# Patient Record
Sex: Female | Born: 1975
Health system: Southern US, Community
[De-identification: ages and names within clinical notes are randomized; demographics above are authoritative.]

## PROBLEM LIST (undated history)

## (undated) DIAGNOSIS — D499 Neoplasm of unspecified behavior of unspecified site: Secondary | ICD-10-CM

## (undated) HISTORY — PX: ABDOMINAL HYSTERECTOMY: SHX81

## (undated) HISTORY — PX: CHOLECYSTECTOMY: SHX55

---

## 1997-11-20 ENCOUNTER — Inpatient Hospital Stay (HOSPITAL_COMMUNITY): Admission: AD | Admit: 1997-11-20 | Discharge: 1997-11-20 | Payer: Self-pay | Admitting: Obstetrics and Gynecology

## 1997-12-27 ENCOUNTER — Inpatient Hospital Stay (HOSPITAL_COMMUNITY): Admission: AD | Admit: 1997-12-27 | Discharge: 1997-12-27 | Payer: Self-pay | Admitting: Obstetrics and Gynecology

## 1998-01-13 ENCOUNTER — Inpatient Hospital Stay (HOSPITAL_COMMUNITY): Admission: AD | Admit: 1998-01-13 | Discharge: 1998-01-13 | Payer: Self-pay | Admitting: *Deleted

## 1998-03-10 ENCOUNTER — Inpatient Hospital Stay (HOSPITAL_COMMUNITY): Admission: AD | Admit: 1998-03-10 | Discharge: 1998-03-10 | Payer: Self-pay | Admitting: Obstetrics and Gynecology

## 1998-03-14 ENCOUNTER — Inpatient Hospital Stay (HOSPITAL_COMMUNITY): Admission: AD | Admit: 1998-03-14 | Discharge: 1998-03-14 | Payer: Self-pay | Admitting: *Deleted

## 1998-03-17 ENCOUNTER — Inpatient Hospital Stay (HOSPITAL_COMMUNITY): Admission: AD | Admit: 1998-03-17 | Discharge: 1998-03-19 | Payer: Self-pay | Admitting: *Deleted

## 1998-03-22 ENCOUNTER — Inpatient Hospital Stay (HOSPITAL_COMMUNITY): Admission: AD | Admit: 1998-03-22 | Discharge: 1998-03-22 | Payer: Self-pay | Admitting: Obstetrics and Gynecology

## 1998-04-18 ENCOUNTER — Other Ambulatory Visit: Admission: RE | Admit: 1998-04-18 | Discharge: 1998-04-18 | Payer: Self-pay | Admitting: *Deleted

## 1999-05-07 ENCOUNTER — Other Ambulatory Visit: Admission: RE | Admit: 1999-05-07 | Discharge: 1999-05-07 | Payer: Self-pay | Admitting: Obstetrics and Gynecology

## 2001-03-17 ENCOUNTER — Other Ambulatory Visit: Admission: RE | Admit: 2001-03-17 | Discharge: 2001-03-17 | Payer: Self-pay | Admitting: Obstetrics and Gynecology

## 2001-06-08 ENCOUNTER — Emergency Department (HOSPITAL_COMMUNITY): Admission: EM | Admit: 2001-06-08 | Discharge: 2001-06-08 | Payer: Self-pay | Admitting: Emergency Medicine

## 2001-10-12 ENCOUNTER — Encounter: Payer: Self-pay | Admitting: Obstetrics and Gynecology

## 2001-10-12 ENCOUNTER — Ambulatory Visit (HOSPITAL_COMMUNITY): Admission: RE | Admit: 2001-10-12 | Discharge: 2001-10-12 | Payer: Self-pay | Admitting: Obstetrics and Gynecology

## 2001-12-01 ENCOUNTER — Encounter: Payer: Self-pay | Admitting: Obstetrics and Gynecology

## 2001-12-01 ENCOUNTER — Ambulatory Visit (HOSPITAL_COMMUNITY): Admission: RE | Admit: 2001-12-01 | Discharge: 2001-12-01 | Payer: Self-pay | Admitting: Obstetrics and Gynecology

## 2001-12-11 ENCOUNTER — Ambulatory Visit (HOSPITAL_COMMUNITY): Admission: RE | Admit: 2001-12-11 | Discharge: 2001-12-11 | Payer: Self-pay

## 2001-12-11 ENCOUNTER — Encounter (INDEPENDENT_AMBULATORY_CARE_PROVIDER_SITE_OTHER): Payer: Self-pay

## 2001-12-11 ENCOUNTER — Encounter (INDEPENDENT_AMBULATORY_CARE_PROVIDER_SITE_OTHER): Payer: Self-pay | Admitting: *Deleted

## 2002-02-09 ENCOUNTER — Observation Stay (HOSPITAL_COMMUNITY): Admission: RE | Admit: 2002-02-09 | Discharge: 2002-02-10 | Payer: Self-pay | Admitting: Obstetrics and Gynecology

## 2002-02-09 ENCOUNTER — Encounter (INDEPENDENT_AMBULATORY_CARE_PROVIDER_SITE_OTHER): Payer: Self-pay

## 2002-04-19 ENCOUNTER — Other Ambulatory Visit: Admission: RE | Admit: 2002-04-19 | Discharge: 2002-04-19 | Payer: Self-pay | Admitting: Obstetrics and Gynecology

## 2002-08-25 ENCOUNTER — Emergency Department (HOSPITAL_COMMUNITY): Admission: EM | Admit: 2002-08-25 | Discharge: 2002-08-26 | Payer: Self-pay | Admitting: Emergency Medicine

## 2003-10-24 ENCOUNTER — Inpatient Hospital Stay (HOSPITAL_COMMUNITY): Admission: EM | Admit: 2003-10-24 | Discharge: 2003-10-27 | Payer: Self-pay | Admitting: Psychiatry

## 2004-03-17 ENCOUNTER — Emergency Department (HOSPITAL_COMMUNITY): Admission: EM | Admit: 2004-03-17 | Discharge: 2004-03-17 | Payer: Self-pay | Admitting: Emergency Medicine

## 2004-07-15 ENCOUNTER — Emergency Department (HOSPITAL_COMMUNITY): Admission: EM | Admit: 2004-07-15 | Discharge: 2004-07-15 | Payer: Self-pay | Admitting: Emergency Medicine

## 2004-07-17 ENCOUNTER — Inpatient Hospital Stay (HOSPITAL_COMMUNITY): Admission: EM | Admit: 2004-07-17 | Discharge: 2004-07-18 | Payer: Self-pay | Admitting: Emergency Medicine

## 2008-04-23 ENCOUNTER — Emergency Department (HOSPITAL_COMMUNITY): Admission: EM | Admit: 2008-04-23 | Discharge: 2008-04-23 | Payer: Self-pay | Admitting: Emergency Medicine

## 2008-08-28 ENCOUNTER — Emergency Department (HOSPITAL_COMMUNITY): Admission: EM | Admit: 2008-08-28 | Discharge: 2008-08-28 | Payer: Self-pay | Admitting: Emergency Medicine

## 2009-01-10 ENCOUNTER — Emergency Department (HOSPITAL_COMMUNITY): Admission: EM | Admit: 2009-01-10 | Discharge: 2009-01-10 | Payer: Self-pay | Admitting: Emergency Medicine

## 2009-01-10 ENCOUNTER — Encounter (INDEPENDENT_AMBULATORY_CARE_PROVIDER_SITE_OTHER): Payer: Self-pay | Admitting: Emergency Medicine

## 2009-01-10 ENCOUNTER — Ambulatory Visit: Payer: Self-pay | Admitting: Vascular Surgery

## 2009-02-10 ENCOUNTER — Emergency Department (HOSPITAL_COMMUNITY): Admission: EM | Admit: 2009-02-10 | Discharge: 2009-02-10 | Payer: Self-pay | Admitting: Emergency Medicine

## 2009-06-02 ENCOUNTER — Emergency Department (HOSPITAL_COMMUNITY): Admission: EM | Admit: 2009-06-02 | Discharge: 2009-06-02 | Payer: Self-pay | Admitting: Emergency Medicine

## 2011-03-05 NOTE — H&P (Signed)
[facility]  Patient:    Sandra Reilly, Sandra Reilly Visit Number: [id number] MRN: [id number]          Service Type: DSU Location: 9300 [id number] Attending Physician:  Jaymes Graff A Dictated by:   Pierre Bali Normand Sloop, M.D. Admit Date:  02/09/2002 Discharge Date: 02/10/2002                           History and Physical  HISTORY OF PRESENT ILLNESS:   The patient is a 35 year old African American female G3, P2-0-1-2 who presented to me complaining of painful periods, dysmenorrhea, and heavy periods since the birth of her last child three years ago.  The patient has tried birth control pills since 2002.  She has been on Levlen and Orthro Tri-Cyclen without any relief.  She did have two ultrasounds which said she has a small anterior fibroid; however, her most recent ultrasound was unable to see a fibroid.  She states that her menses are occurring every 28 days, lasting for seven days, and also has some intermenstrual spotting.  The patient has been on Vicodin and Naprosyn and many other narcotic pain medicines, which give temporary relief but does not provide sufficient relief, as per the patient.  The patient did have a D&C hysteroscopy and laparoscopy and the endometrium was significant for simple hyperplasia without atypia.  The biopsy of the peritoneum was found to be negative and peritoneal washings were found to be negative.  After the [facility] hysteroscopy, the patient still presented with painful menses and desired to have a hysterectomy.  The patient was offered medical treatment such as Lupron, Depo-Provera, and birth control pills or just plain Provera for the simple hyperplasia, which did also help her pain but the patient wanted definitive treatment and decided upon having a hysterectomy.  PAST MEDICAL HISTORY:       As above.  PAST SURGICAL HISTORY:      1. Tubal ligation.                               2. Laparoscopic cholecystectomy.               3. Diagnostic laparoscopy.                               4. Dilation and curettage hysteroscopy.  PAST OBSTETRICAL HISTORY:     Significant for full-term vaginal deliveries x2 without any complications.  She had one elective abortion at the age of 67 in the first trimester without any complications.  SOCIAL HISTORY:               Negative x3.  ALLERGIES:                    The patient has no known drug allergies.  FAMILY HISTORY:               Significant for hypertension and her grandmother has some kind of unknown cancer.  CURRENT MEDICATIONS:          Naprosyn and Vicodin as needed.  PHYSICAL EXAMINATION:  VITAL SIGNS:                  Blood pressure 110/80, weight 244 pounds.  HEART:  Regular rate and rhythm.  CHEST:                        Clear to auscultation bilaterally.  ABDOMEN:                      Nondistended and nontender.  No masses or organomegaly.  PELVIS:                       Vaginal exam in nontender.  Cervix is nontender without any lesions.  Uterus is about 8-week size of normal shape and consistency and nontender.  Adnexa has no masses.  The patients most recent ultrasound shows the uterus measuring approximately 8.7 x 5.7 x 6.5 cm and retroverted.  The endometrium is approximately 5 mm in its greatest thickness. They saw no adnexal masses.  The ovaries were visualized and there was a dominant follicle seen in the left ovary measuring 3.8 x 2.3 x 4.3.  The right ovary was normal in appearance and no free fluid was seen.  The previous fibroid that was seen on ultrasound in December 2002 was not seen on this study but measures 3.3 x 2.5 x 3.2 cm.  ASSESSMENT:                   1. Chronic pelvic pain.                               2. Uterine fibroids.                               3. 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  PLAN:                         The patient  desires hysterectomy.  She was told that a hysterectomy may not relieve her pelvic pain and she was also given the options of medical versus surgical treatment.  The patient chose to have surgical treatment.  She understands the risks of the procedure are but not limited to bleeding, infection, damage to internal organs such as bowel and bladder.  We will attempt a laparoscopic-assisted vaginal hysterectomy.  The patient does want to preserve her ovaries.  She understands if there is endometriosis present she may still have pain if both ovaries are retained and she desires to keep both ovaries.  She also understands that, if we are unable to do a vaginal hysterectomy, then she will have to have a total abdominal hysterectomy. Dictated by:   Pierre Bali. Normand Sloop, M.D. Attending Physician:  Michael Litter DD:  02/09/02 TD:  02/09/02 Job: [id number]

## 2011-03-05 NOTE — Op Note (Signed)
[facility]  Patient:    Sandra Reilly, Sandra Reilly Visit Number: [id number] MRN: [id number]          Service Type: DSU Location: 9300 [id number] Attending Physician:  Jaymes Graff A Dictated by:   Pierre Bali Normand Sloop, M.D. Proc. Date: 02/09/02 Admit Date:  02/09/2002 Discharge Date: 02/10/2002                             Operative Report  PREOPERATIVE DIAGNOSES:       1. Uterine fibroid.                               2. Chronic pelvic pain.                               3. Uterine simple hyperplasia.  POSTOPERATIVE DIAGNOSES:      1. Uterine fibroid.                               2. Chronic pelvic pain.                               3. Uterine simple hyperplasia.  OPERATION:                    Laparoscopically assisted vaginal hysterectomy.  SURGEON:                      Naima A. Normand Sloop, M.D.  ASSISTANT:                    Silverio Lay, M.D.  ESTIMATED BLOOD LOSS:         75 cc.  URINE OUTPUT:                 150 cc of clear urine at the end of the                               procedure.  IV FLUIDS:                    2400 cc.  ANESTHESIA:                   General endotracheal tube and 6 cc                               0.25% Marcaine.  COMPLICATIONS:                None.  FINDINGS:                     8-10 week size boggy uterus in appearance and shape.  No apparent fibroid seen.  She had normal tubes and ovaries bilaterally, and normal abdominal anatomy.  The appendix was visualized and noted to be normal.  The patient went to the recovery room in stable condition.  PROCEDURE IN DETAIL:          The patient was taken to the operating room, placed in dorsal lithotomy position; prepped and draped in the usual sterile fashion.  Attention was then turned to the vagina, where a bivalve speculum was placed into the vagina.  Anterior lip of the cervix was grasped with a single-tooth tenaculum and the Hulka tenaculum was placed into the uterus as  a uterine manipulator.  The bivalve speculum was removed.  The Foley catheter was placed and the patient was then prepped and draped in a normal sterile fashion.  A 10 mm infraumbilical vertical incision was then made with the knife, and a Veress needle was then inserted at 45-degree angle while searching the abdominal wall.  Intraabdominal placement was confirmed with a fluid-filled syringe decreasing pressure with insufflation of CO2 gas.  The abdomen was insufflated with about 3 L CO2 gas and the Veress needle was then removed.  The 10 mm trocar was then placed into the abdomen without difficulty.  Intraabdominal placement was confirmed with the laparoscope. A 5 mm trocar was then placed in the right lower quadrant with careful note of the epigastric vessels.  Under direct visualization hemostasis was noted.  The 5 mm trocar was then placed into the left lower quadrant and 3 cc Marcaine was placed before each incision was made with the knife, and the 5 mm trocar placed under direct visualization with the laparoscope.  The round ligaments were then cauterized and cut.  The uterine-ovarian ligaments were also cauterized and cut.  Hemostasis was noted.  The bladder flap was then created using Metzenbaum scissors and blunt and sharp dissection.  Hemostasis was noted.  All instruments were then removed from the abdomen.  The trocars remained in place.  The abdomen was then covered with a towel.  Attention was then turned to the vagina. A weighted speculum was placed into the vagina. Sims retractors and skin retractors were used for retraction.  The Hulka tenaculum was then removed from the uterus.  Two tenaculums were placed on the cervix.  The cervix was then infiltrated with 20 cc of Pitressin mixture, 20 units of 100 cc of normal saline.  The cervix was then cut in a circumferential manner using the knife.  The bladder was dissected away bluntly and the anterior peritoneum was identified,  tented up and entered sharply.  Hemostasis was noted.  The posterior peritoneum was then identified, tented up and then entered sharply using Mayo scissors.  At this point both uterosacral ligaments were clamped with Heaney clamps, transected and suture ligated.  Hemostasis was noted.  The uterine arteries were then singly clamped, transected and suture ligated.  Hemostasis was noted.  The uterus was then delivered through the vagina.  Hemostasis was noted.  Then 0 Vicryl was used to close the peritoneum in a pursestring fashion.  McCall suture was placed with 0 Vicryl without difficulty, and the two uterosacral stitches that were held were tied together for support.  The vaginal cuff was closed in a vertical fashion, using 0 Vicryl on a GU needle and also using 0 chromic on a GU needle.  Hemostasis was assured.  The McCall suture was placed without difficulty.  Attention was then turned to the patients abdomen, where she was allowed to inflate the abdomen with CO2 gas.  All pedicles were noted to be hemostatic. The abdominal cavity was irrigated with lactated Ringers.  Again, all pedicles were noted to be hemostatic.  All instruments and ports were removed under direct visualization with the laparoscope.  The 10 mm port fascia was closed with 0 Vicryl.  The skin was closed with 3-0 Vicryl subcuticular fashion.  The  vagina was packed with vaginal packing.  Sponge, lap and needle counts were correct x2.  Also of note, both ureters were visualized at the second look, and both were noted to be peristalsing.  Also note, the patient was given option of medical versus surgical management. Which included but not limited to:  birth control pills, Depo-Provera, and Lupron, myomectomy, hysterectomy.  The patient chose hysterectomy.  She understood that the risks were, but not limited to, bleeding, infection, damage to internal organs (such as bowel, bladder and ureter). Dictated by:   Pierre Bali.  Normand Sloop, M.D. Attending Physician:  Michael Litter DD:  02/09/02 TD:  02/10/02 Job: [id number]

## 2011-03-05 NOTE — Discharge Summary (Signed)
Sandra Reilly, Sandra Reilly              ACCOUNT NO.:  [id number]   MEDICAL RECORD NO.:  [id number]          PATIENT TYPE:  INP   LOCATION:  0255                         FACILITY:  [facility]   PHYSICIAN:  Melissa L. Ladona Ridgel, MD  DATE OF BIRTH:  1975/11/02   DATE OF ADMISSION:  07/16/2004  DATE OF DISCHARGE:  07/18/2004                                 DISCHARGE SUMMARY   DISCHARGE DIAGNOSES:  1.  Acute pyelonephritis on clinical examination. Ultrasound of the kidneys      did not reveal any hydronephrosis but did report medical renal disease      which likely is consistent with pyelonephritis. The patient at this time      is tolerating oral medications and foods despite having fever. I believe      that she can complete her therapy at home. Her Escherichia coli is      sensitive to Cipro; will therefore continue this for a full course of 14      days.  2.  Tobacco abuse. The patient has been counseled on the adverse affects for      this.  3.  Nausea and vomiting secondary to #1. We will provide the patient with a      limited amount of antiemetic medication at the time of discharge.  4.  Pain in the costovertebral areas. We will provide her with pain      medication, Percocet 5/325, limited number of pills. We will request      that the patient follow up with her primary care practitioner, Dr.      Merri Brunette, mid week to assure that her symptoms are resolving.   MEDICATIONS AT DISCHARGE:  1.  Cipro 500 mg p.o. b.i.d.  2.  Percocet 5/325 p.o. q.4-6h. p.r.n. as needed for back pain.  3.  The patient has phenergan suppositories from her ER visit.   As stated, pain management will be with oral Percocet.   DIET:  The patient will be instructed to drink a lot of fluids to keep  herself well hydrated.   SPECIAL INSTRUCTIONS:  She is to call her primary care physician if she  continues to experience nausea, vomiting, and fevers.   She is to make an appointment to see Dr. Merri Brunette  Tuesday or Wednesday  to follow up on her symptoms.   HISTORY OF PRESENT ILLNESS:  The patient is a 35 year old African-American  female who developed worsening left sided back pain and chills with fever,  nausea and vomiting approximately 5 days prior to admission. She presented  to the emergency room for evaluation and was diagnosed with a urinary tract  infection and discharged to home with Phenergan, Naproxen, and Cipro. She  states that she returned home but could not take any of her medications  because she continued to vomit. She represented to the emergency room on the  day of admission with worsening symptoms of fevers, chills, nausea, and  vomiting and was admitted for supportive care and further workup of her  symptoms. The patient was given IV ciprofloxacin and hydration. Her  potassium was  repleted during the course of hospital stay. An ultrasound was  completed of her bilateral kidneys showing medical renal disease likely  reflecting her current infection but did not suggest any abscess formation  or hydronephrosis. The patient has slowly continued to improve. On the day  of discharge, she states that she did have one episode of nausea but has  been able to keep down her pills and is eating. She did have a temperature  spike of the low 100s on the night prior to discharge.   PHYSICAL EXAMINATION:  VITAL SIGNS:  Her vital signs the day of discharge  were stable other than the temperature spike to 101. Blood pressure was  109/63. Pulse was 86. Respiratory rate was 20.  GENERAL:  She appeared in minimal distress related to continuing back pain.  HEENT:  Pupils are equal, round, and reactive to light. Extraocular  movements are intact. Mucous membranes are moist.  NECK:  Supple. There is no JVD, no lymph nodes, and no carotid bruits.  CHEST:  Clear to auscultation. There are no rhonchi, rales, or wheezes.  CARDIOVASCULAR:  Regular rate and rhythm. S1 and S2. No S3 or S4. No   murmurs, rubs, or gallops .  ABDOMEN:  Soft, nontender, nondistended, positive bowel sounds. She does  have some mild costovertebral angle tenderness bilaterally.  EXTREMITIES:  She has no edema and is able to ambulate the floor.   PERTINENT LABORATORY VALUES DURING TEH COURSE OF THE HOSPITAL STAY:  On the  day of discharge, her white count is 7.3 with a hemoglobin of 10, hematocrit  of 30, and platelets of 208. Her sodium was 138, potassium 4.4, chloride  113, CO2 25, BUN 6, creatinine 1.1. Vitamin B12 and folate and ferritin were  all within normal limits. Her iron studies, however, slightly low possibly  reflecting healthy menstruating female. Her urine pregnancy was negative,  and as stated, her urine culture grew Escherichia coli sensitive to  ampicillin, cefazolin, ceftriaxone, and ciprofloxacin as well as Levaquin.  Her blood cultures at this time are pending. The outcome will not change my  current management.   DISPOSITION:  The patient will be changed over to oral medications. I will  saline lock her IV. If she continues to be able to tolerate oral medications  during the course of the day,  I will discharge her to home later this  afternoon. Should she develop worsening nausea and vomiting or increased  fever, I will keep her in the hospital for IV therapy.   As long as the patient continues to improve, I will deem her stable for  discharge to home on oral antibiotics and follow up with Dr. Katrinka Blazing.     Meli   MLT/MEDQ  D:  07/18/2004  T:  07/19/2004  Job:  [id number]   cc:   Dario Guardian, M.D.  [address]  [place]  [place] [address]  Fax: [phone]

## 2011-03-05 NOTE — H&P (Signed)
NAMEEDA, MAGNUSSEN              ACCOUNT NO.:  [id number]   MEDICAL RECORD NO.:  [id number]          PATIENT TYPE:  INP   LOCATION:  0255                         [facility]ACILITY:  [facility]   PHYSICIAN:  Jackie Plum, M.D.DATE OF BIRTH:  [birth year]   DATE OF ADMISSION:  07/17/2004  DATE OF DISCHARGE:                                HISTORY & PHYSICAL   CHIEF COMPLAINT:  Back pain, nausea and vomiting x5 days.   HISTORY OF PRESENT ILLNESS:  The patient is a 35 year old African-American  lady who presented with five day history of worsening left sided back pain  with cold chills, fever, nausea and vomiting.  She was apparently in her  usual state of health until about five days ago  when she started having  some left sided back pain which she initially attributed to moving things in  her house.  This got worse and worse and was seen in the ED whereupon she  was told that she had a urinary tract infection and discharged home on  Phenergan, naproxen and Cipro. However, she could not take these medications  because she kept on vomiting. She came back to the OR last night on account  of worsening symptoms with cold chills and fever. She denied a history of  dysuria, frequency or micturition. No diarrhea or constipation but admitted  to decreased appetite. She denies any history of cough or shortness of  breath or chest pain.   PAST MEDICAL HISTORY:  Negative for any history of diabetes mellitus or  hypertension. She has had a urinary tract infection on two occasions  previously. She has had gallbladder surgery previously.   ALLERGIES:  No history of known drug allergies.   CURRENT MEDICATIONS:  Multivitamin, ciprofloxacin, Phenergan suppository and  naproxen.   SOCIAL HISTORY:  The patient is a [employer], she  has two children and she is separated from her husband at the moment. She  smokes one pack of cigarettes daily over the last one year.   REVIEW OF  SYMPTOMS:  Significant positives and negatives as noted in the  HPI.  Other systems unremarkable.   PHYSICAL EXAMINATION:  VITAL SIGNS:  Temperature was 101.0 initially and had  come down to 100.0.  Blood pressure 106/64, pulse rate of 84, respiratory  rate of 16, O2 sat of 97% on room air. Cardiopulmonary, she is not in acute  cardiopulmonary distress.  HEENT:  Normocephalic, atraumatic.  Pupils equal and reactive to light.  Extraocular movements intact. She has some dryness of the oropharynx without  any exudate or erythema.  NECK:  Supple, no JVD.  LUNGS:  Vesicular breath sounds, no adventitious sounds.  CARDIAC:  Shows mildly tachycardic without any gallops or murmur.  ABDOMEN:  She did not have any tenderness; however, according to ED patient  notes, the patient initially had some upper abdominal tenderness which I  could not produce on my exam.  BACK:  She has left costovertebral angle tenderness on back exam. __________  was warm and dry.  EXTREMITIES:  Negative for any edema.   LABORATORY DATA:  WBC count  10.1 (WBC was 13.7 on September 28), hemoglobin  10.6, hematocrit of 12.9, MCV 88.1, platelet count 198.  Sodium 137,  potassium 3.2, chloride 109, CO2 25, glucose 135, BUN 6, creatinine 1.1.  Calcification 7.9, albumin 2.9 on July 15, 2004. UA on September 28 was  notable for blood which was moderate, more than 80 ketones, positive  nitrite, moderate leukocyte esterase and many bacteria with a WBC of 21 to  50 per high powered field.   IMPRESSION:  1.  Acute left pyelonephritis.  2.  Hypokalemia.  3.  Normocytic anemia.   PLAN:  The patient will be admitted to regular bed. She will be started on  IV antibiotics with pain relief and other supportive measures including IV  fluids and antiemetics for now. She will benefit from ultrasound of the  kidneys.      GO/MEDQ  D:  07/17/2004  T:  07/17/2004  Job:  [id number]   cc:   Dario Guardian, M.D.  [address]  [place]  [place] [address]  Fax: [phone]
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
## 2011-03-05 NOTE — H&P (Signed)
[facility]  Patient:    Sandra Reilly, Sandra Reilly Visit Number: [id number] MRN: [id number]          Service Type: Attending:  Naima A. Normand Sloop, M.D. Dictated by:   Pierre Bali. Normand Sloop, M.D.                           History and Physical  DATE OF BIRTH:                Dec 26, 1975  HISTORY OF PRESENT ILLNESS:   The patient is a 35 year old African American female, G3, P2-0-1-2, who presented complaining of painful periods since the birth of her last child three years ago.  The pain has not been relieved by Vicodin or Naprosyn.  The patient has occasional intake of birth control pills which does have some relief.  She says that her menses are occurring every 28 days, lasting for 7 days and she has intermenstrual spotting and patient desires diagnostic laparoscopy in order to see if there is anything wrong.  PAST MEDICAL HISTORY:         Unremarkable.  PAST SURGICAL HISTORY:        Significant for a tubal ligation and laparoscopic cholecystectomy.  PAST OBSTETRICAL HISTORY:     Significant for a full-term vaginal delivery x2 without any complications and one elective abortion at the age of 54 in the first trimester.  SOCIAL HISTORY:               Negative x3.  FAMILY HISTORY:               Significant for hypertension and her grandmother has some kind of cancer.  MEDICATIONS:                  Naprosyn p.r.n.  PHYSICAL EXAMINATION:  VITAL SIGNS:                  The patient weighs 239 pounds, blood pressure is 110/70.  ABDOMEN:                      Mild right lower quadrant tenderness, no rebound tenderness and no guarding.  PELVIC:                       A full vaginal exam is within normal limits. Cervix is nontender.  Uterus is normal size, shape and consistency.  Adnexa has no masses.  She did have a frothy discharge.  The patient was found to have Trichomonas.  DIAGNOSTIC STUDIES:           The patient also had an ultrasound at [facility]  which demonstrated that her uterus was 12.6 x 7.3 x 6.2 and demonstrated a fibroid measuring 3.5 x 4.5 x 4.3 cm.  GC and Chlamydia were both found to be negative in December 2002.  Again an ultrasound in the hospital they measured an anterior fibroid measuring 3.3 x 2.5 x 3.2 cm and the uterus measuring 10.3 x 6.6 x 6.3.  The left ovary was normal, the right ovary could not be seen.  The patient also had a recent sonohysterogram which showed that her uterus was 8.7 x 5.7 x 6.5 and retroverted; they did not see any discrete myometrial masses.  The endometrium measured 5 mm in thickness.  The left ovary was 3.8 x 2.3 x 4.3 with a dominant follicle.  The right ovary was also  normal.  There was no free fluid.  However, during the sonohysterogram it was noted that the patient may have a tiny 4-mm endometrial polyp in the lower uterine segment.  The patient did come back for a test of cure and the Trichomonas was resolved after taking the Flagyl.  ASSESSMENT AND PLAN:          The patient was given Ortho Tri-Cyclen and told that we would proceed with a diagnostic laparoscopy to rule out any kind of endometriosis and dilatation and curettage, hysteroscopy to remove the polyp. The patient understands that the risks of the procedure are, but not limited to, bleeding, infection, damage to internal organs such as bowel, bladder, major blood vessels; perforation of the uterus and Asherman syndrome.  The patient is agreeable to the procedure. Dictated by:   Pierre Bali. Normand Sloop, M.D. Attending:  Naima A. Dillard, M.D. DD:  12/11/01 TD:  12/11/01 Job: [id number]

## 2011-03-05 NOTE — Op Note (Signed)
[facility]  Patient:    Sandra Reilly, Sandra Reilly Visit Number: [id number] MRN: [id number]          Service Type: DSU Location: [facility] Attending Physician:  Jaymes Graff A Dictated by:   Pierre Bali Normand Sloop, M.D. Admit Date:  12/11/2001                             Operative Report  PREOPERATIVE DIAGNOSIS:       Endometrial polyp and chronic pelvic pain.  POSTOPERATIVE DIAGNOSIS:      Normal endometrium.  No submucosal fibroids seen, enlarged uterus consistent with adenomyosis.  She had a positive peritoneal window with a white questionable endometrial implant along the right sidewall.  She had a right abdominal wall-right colon filmy adhesion. She had a right tubal abdominal wall adhesion.  There was no break seen in the tube bilaterally to indicate that the patient actually had a tubal ligation in the past.  She has normal ovaries and tubes bilaterally, normal abdominal anatomy.  She had a right corpus luteum cyst.  OPERATION:                    Dilatation and curettage, hysteroscopy, diagnostic laparoscopy, biopsy of peritoneal window, lysis of adhesions.  SURGEON:                      Naima A. Normand Sloop, M.D.  ANESTHESIA:                   General endotracheal anesthesia.  ESTIMATED BLOOD LOSS:         Minimal.  IV FLUIDS:                    2000 cc crystalloid, minus 20 cc sorbitol deficit with the hysteroscopic fluid.  URINE OUTPUT:                 100 cc clear urine.  COMPLICATIONS:                None.  FINDINGS:                     As above.  DISPOSITION:                  The patient returned to the recovery room in stable condition.  DESCRIPTION OF PROCEDURE:     The patient was taken to the operating room. She was given general anesthesia, placed in the dorsal lithotomy position and prepped and draped in the normal sterile fashion.  A bivalve speculum was placed in the vagina.  The anterior lip of the cervix grasped with a single-tooth  tenaculum.  Cervix was then dilated to 21 size hysteroscope was placed in the uterus.  Both ostia were seen.  There was an area of fluffy endometrium but there were no polyps, no submucosal fibroids, normal cavity and the uterus did sound to 10 with the sound.  Again, both ostia were visualized.  Sharp curettage was done and scanty endometrial curettings were obtained.  Attention was then turned to the patients abdomen where a 10 mm infraumbilical incision was made with a knife after 5 cc 0.25% Marcaine was placed in the proposed incision site.  A vertical incision was then made with the knife.  A Veress needle was placed into the abdominal cavity at a 45 degree angle.  Intra-abdominal placement was confirmed  with fluid-filled syringe and decrease in CO2 pressure.  The abdomen was filled with three liters of CO2 gas and 10 mm trocar was then placed under direct visualization with the laparoscope.  Hemostasis was assured.  The findings noted above were seen with a pretty large, globular uterus.  No discrete fibroids but just a globular-looking uterus.  The right ovary had a normal corpus luteum cyst and the right tube appeared normal, but adherent to the right side of the abdominal wall.  However, there were no breaks in the tube consistent with a tubal ligation which the patient states that she did have.  There were some filmy adhesions around the right colon to the abdominal wall which were lysed. However, the appendix was still unable to be found but there were no signs of infection.  Liver and bowel were normal in appearance.  The rest of the bowel was normal in appearance.  The patients left ovary and tube were normal. Also no break in the left tube to indicate the patient had a tubal ligation. The left ovary was normal.  When we looked into the cul-de-sac, there was yellowish fluid, normal culdocentesis fluid, which was sent for pathology. Before any biopsies could be done, a 5 mm  skin incision was made 2 cm above the symphysis pubis.  The 5 mm trocar was placed under direct visualization with the laparoscope.  Biopsy forceps were placed into the peritoneal window and biopsied.  There was a white implant along the upper side, just kind of to the right cornua of the uterus which was difficult to pick up with biopsy forceps.  So it was attempted; however, unsuccessful, small, about 1 mm size. Peritoneal window, however, was biopsied.  The fluid was also obtained with the syringe and sent for pathology.  The filmy adhesions to the right tube were also lysed, coagulated and cut.  The peritoneal window had some bleeding which was also coagulated with bipolar and felt to be hemostatic.  All instruments were then removed under direct visualization of the laparoscope.  The 5 mm trocar was removed under direct visualization of the laparoscope.  The CO2 was allowed to leave the abdomen.  The trocar was removed under direct visualization of the laparoscope.  The umbilical fascia was approximated with 0 Vicryl.  The skin was closed with 4-0 Vicryl in a subcutaneous fashion.  The 5 mm port was closed with a subcuticular stitch. The tenaculum and acorn were removed without difficulty.  There was some bleeding noted from the tenaculum site on the cervix which was made hemostatic with pressure.  All instruments were removed from the vagina as well.  Sponge, lap and needle counts were correct x 2.  The patient returned to the recovery room in stable condition.  Dictated by:   Pierre Bali. Normand Sloop, M.D. Attending Physician:  Michael Litter DD:  12/11/01 TD:  12/11/01 Job: [id number]

## 2011-07-20 LAB — CBC
MCV: 89.2
RDW: 13.4

## 2011-07-20 LAB — POCT I-STAT, CHEM 8
BUN: 14
Calcium, Ion: 1.26
Chloride: 107
Glucose, Bld: 84
HCT: 40
Sodium: 139
TCO2: 23

## 2011-07-20 LAB — URINALYSIS, ROUTINE W REFLEX MICROSCOPIC
Bilirubin Urine: NEGATIVE
Glucose, UA: NEGATIVE
Hgb urine dipstick: NEGATIVE
Nitrite: NEGATIVE

## 2011-07-20 LAB — POCT PREGNANCY, URINE: Preg Test, Ur: NEGATIVE

## 2011-07-20 LAB — DIFFERENTIAL
Eosinophils Relative: 1
Lymphocytes Relative: 52 — ABNORMAL HIGH
Lymphs Abs: 3.7
Monocytes Absolute: 0.6
Neutrophils Relative %: 38 — ABNORMAL LOW

## 2013-05-24 ENCOUNTER — Emergency Department (HOSPITAL_BASED_OUTPATIENT_CLINIC_OR_DEPARTMENT_OTHER)
Admission: EM | Admit: 2013-05-24 | Discharge: 2013-05-25 | Disposition: A | Discharge status: Home / Self Care | Payer: Self-pay | Attending: Emergency Medicine | Admitting: Emergency Medicine

## 2013-05-24 ENCOUNTER — Encounter (HOSPITAL_BASED_OUTPATIENT_CLINIC_OR_DEPARTMENT_OTHER): Payer: Self-pay

## 2013-05-24 DIAGNOSIS — N3090 Cystitis, unspecified without hematuria: Secondary | ICD-10-CM | POA: Insufficient documentation

## 2013-05-24 DIAGNOSIS — F17200 Nicotine dependence, unspecified, uncomplicated: Secondary | ICD-10-CM | POA: Insufficient documentation

## 2013-05-24 DIAGNOSIS — Z8589 Personal history of malignant neoplasm of other organs and systems: Secondary | ICD-10-CM | POA: Insufficient documentation

## 2013-05-24 DIAGNOSIS — Z3202 Encounter for pregnancy test, result negative: Secondary | ICD-10-CM | POA: Insufficient documentation

## 2013-05-24 DIAGNOSIS — A5901 Trichomonal vulvovaginitis: Secondary | ICD-10-CM | POA: Insufficient documentation

## 2013-05-24 HISTORY — DX: Neoplasm of unspecified behavior of unspecified site: D49.9

## 2013-05-24 LAB — URINALYSIS, ROUTINE W REFLEX MICROSCOPIC
Glucose, UA: NEGATIVE mg/dL
Nitrite: NEGATIVE
pH: 6 (ref 5.0–8.0)

## 2013-05-24 LAB — URINE MICROSCOPIC-ADD ON

## 2013-05-24 LAB — PREGNANCY, URINE: Preg Test, Ur: NEGATIVE

## 2013-05-24 NOTE — ED Notes (Signed)
Reports hysterectomy 15 yrs ago-vaginal bleeding started today

## 2013-05-25 LAB — WET PREP, GENITAL
WBC, Wet Prep HPF POC: NONE SEEN
Yeast Wet Prep HPF POC: NONE SEEN

## 2013-05-25 MED ORDER — NITROFURANTOIN MONOHYD MACRO 100 MG PO CAPS: 100.0000 mg | ORAL_CAPSULE | Freq: Two times a day (BID) | ORAL | Status: DC

## 2013-05-25 MED ORDER — METRONIDAZOLE 500 MG PO TABS
2000.0000 mg | ORAL_TABLET | Freq: Once | ORAL | Status: AC
  Administered 2013-05-25: 2000 mg via ORAL
  Filled 2013-05-25: qty 4

## 2013-05-25 NOTE — ED Provider Notes (Signed)
CSN: [id number]     Arrival date & time 05/24/13  2038 History     First MD Initiated Contact with Patient 05/24/13 2357     Chief Complaint  Patient presents with  . Vaginal Bleeding   (Consider location/radiation/quality/duration/timing/severity/associated sxs/prior Treatment) Patient is a 37 y.o. female presenting with hematuria. The history is provided by the patient.  Hematuria This is a new problem. The current episode started yesterday. The problem occurs constantly. The problem has been resolved. Pertinent negatives include no chest pain, no abdominal pain, no headaches and no shortness of breath. Nothing aggravates the symptoms. Nothing relieves the symptoms. She has tried nothing for the symptoms. The treatment provided no relief.  Patient thought it was vaginal bleeding as she had bleeding when she urinated and saw blood on toilet tissue when she urinated and wiped.  She has had a partial hysterectomy 15 years ago.  No f/c/r.  No flank pain.  No dysuria.  No rectal bleeding.  No trauma  Past Medical History  Diagnosis Date  . Precancerous lesion    Past Surgical History  Procedure Laterality Date  . Abdominal hysterectomy    . Cholecystectomy     No family history on file. History  Substance Use Topics  . Smoking status: Current Every Day Smoker  . Smokeless tobacco: Not on file  . Alcohol Use: No   OB History   Grav Para Term Preterm Abortions TAB SAB Ect Mult Living                 Review of Systems  Constitutional: Negative for fever.  Respiratory: Negative for shortness of breath.   Cardiovascular: Negative for chest pain.  Gastrointestinal: Negative for abdominal pain.  Genitourinary: Positive for hematuria. Negative for dysuria and flank pain.  Neurological: Negative for headaches.  All other systems reviewed and are negative.    Allergies  Review of patient's allergies indicates no known allergies.  Home Medications  No current outpatient  prescriptions on file. BP 124/67  Pulse 85  Temp(Src) 98.7 F (37.1 C) (Oral)  Resp 16  Ht 5\' 5"  (1.651 m)  Wt 183 lb (83.008 kg)  BMI 30.45 kg/m2  SpO2 100% Physical Exam  Constitutional: She is oriented to person, place, and time. She appears well-developed and well-nourished. No distress.  HENT:  Head: Normocephalic and atraumatic.  Mouth/Throat: Oropharynx is clear and moist.  Eyes: Conjunctivae are normal. Pupils are equal, round, and reactive to light.  Neck: Normal range of motion. Neck supple.  Cardiovascular: Normal rate and regular rhythm.   Pulmonary/Chest: Effort normal and breath sounds normal. She has no wheezes. She has no rales.  Abdominal: Soft. Bowel sounds are normal. There is no tenderness. There is no rebound and no guarding.  Genitourinary: Vaginal discharge found.  Chaperone present.  White foamy discharge consistent with trichomonas.  No cervical os.  No skin tears no lacerations no bleeding in the vaginal vault.    Musculoskeletal: Normal range of motion.  Neurological: She is alert and oriented to person, place, and time.  Skin: Skin is warm and dry.  Psychiatric: She has a normal mood and affect.    ED Course   Procedures (including critical care time)  Labs Reviewed  WET PREP, GENITAL - Abnormal; Notable for the following:    Trich, Wet Prep FEW (*)    Clue Cells Wet Prep HPF POC FEW (*)    All other components within normal limits  URINALYSIS, ROUTINE W REFLEX MICROSCOPIC -  Abnormal; Notable for the following:    Hgb urine dipstick LARGE (*)    Ketones, ur 15 (*)    Leukocytes, UA MODERATE (*)    All other components within normal limits  URINE MICROSCOPIC-ADD ON - Abnormal; Notable for the following:    Bacteria, UA FEW (*)    All other components within normal limits  URINE CULTURE  GC/CHLAMYDIA PROBE AMP  PREGNANCY, URINE   No results found. No diagnosis found.  MDM  Patient is taking BC powders for arthritis pain and has a UTI and  this has caused a likely cystitis.  Will also treat for trichomonas.  No sexual activity until 7 days after all partners treated.  No BC powders take all antibiotics and follow up for persistent symptoms with GYN and urology.  Patient verbalizes understanding and agrees to follow up  Tu Shimmel Smitty Cords, MD 05/25/13 1610

## 2013-05-26 LAB — GC/CHLAMYDIA PROBE AMP
CT Probe RNA: NEGATIVE
GC Probe RNA: POSITIVE — AB

## 2013-05-26 LAB — URINE CULTURE: Colony Count: NO GROWTH

## 2013-05-27 ENCOUNTER — Telehealth (HOSPITAL_COMMUNITY): Payer: Self-pay | Admitting: Emergency Medicine

## 2013-05-27 NOTE — ED Notes (Signed)
Patient has +Gonorrhea. °

## 2013-05-27 NOTE — ED Notes (Signed)
+  Gonorrhea. Chart sent to EDP office for review. 

## 2013-05-29 ENCOUNTER — Emergency Department (HOSPITAL_BASED_OUTPATIENT_CLINIC_OR_DEPARTMENT_OTHER)
Admission: EM | Admit: 2013-05-29 | Discharge: 2013-05-29 | Discharge status: Against Medical Advice | Payer: Self-pay | Attending: Emergency Medicine | Admitting: Emergency Medicine

## 2013-05-29 DIAGNOSIS — A549 Gonococcal infection, unspecified: Secondary | ICD-10-CM | POA: Insufficient documentation

## 2013-05-29 MED ORDER — AZITHROMYCIN 250 MG PO TABS: 2000.0000 mg | ORAL_TABLET | Freq: Once | ORAL | Status: DC

## 2013-05-29 NOTE — ED Provider Notes (Signed)
Pt returned due to needing treatment for gonorrhea.  Pt did not check in and was given ppx for 2g of azithro as that is what she can afford  Gwyneth Sprout, MD 05/29/13 1457

## 2013-05-29 NOTE — ED Notes (Signed)
Chart returned from EDP office with orders written by Sharilyn Sites for patient to return for treatment of Rocephin 250 mg IM x 1.Patient informed of positive results after id'd x 2 and she will return for treatment today.

## 2013-05-29 NOTE — ED Notes (Signed)
Pt reports called and informed to come back to er for gonorrhea tx.

## 2016-11-25 ENCOUNTER — Emergency Department (HOSPITAL_BASED_OUTPATIENT_CLINIC_OR_DEPARTMENT_OTHER): Payer: Self-pay

## 2016-11-25 ENCOUNTER — Emergency Department (HOSPITAL_BASED_OUTPATIENT_CLINIC_OR_DEPARTMENT_OTHER)
Admission: EM | Admit: 2016-11-25 | Discharge: 2016-11-25 | Disposition: A | Discharge status: Home / Self Care | Payer: Self-pay | Attending: Emergency Medicine | Admitting: Emergency Medicine

## 2016-11-25 ENCOUNTER — Encounter (HOSPITAL_BASED_OUTPATIENT_CLINIC_OR_DEPARTMENT_OTHER): Payer: Self-pay | Admitting: *Deleted

## 2016-11-25 DIAGNOSIS — R079 Chest pain, unspecified: Secondary | ICD-10-CM | POA: Insufficient documentation

## 2016-11-25 DIAGNOSIS — R1012 Left upper quadrant pain: Secondary | ICD-10-CM | POA: Insufficient documentation

## 2016-11-25 DIAGNOSIS — F17200 Nicotine dependence, unspecified, uncomplicated: Secondary | ICD-10-CM | POA: Insufficient documentation

## 2016-11-25 LAB — CBC
HCT: 36 % (ref 36.0–46.0)
HEMOGLOBIN: 12.3 g/dL (ref 12.0–15.0)
MCH: 30.8 pg (ref 26.0–34.0)
MCHC: 34.2 g/dL (ref 30.0–36.0)
MCV: 90 fL (ref 78.0–100.0)
PLATELETS: 242 10*3/uL (ref 150–400)
RBC: 4 MIL/uL (ref 3.87–5.11)
RDW: 13.7 % (ref 11.5–15.5)
WBC: 5.6 10*3/uL (ref 4.0–10.5)

## 2016-11-25 LAB — COMPREHENSIVE METABOLIC PANEL
ALBUMIN: 3.5 g/dL (ref 3.5–5.0)
ALK PHOS: 50 U/L (ref 38–126)
ALT: 14 U/L (ref 14–54)
ANION GAP: 6 (ref 5–15)
AST: 18 U/L (ref 15–41)
BUN: 16 mg/dL (ref 6–20)
CHLORIDE: 107 mmol/L (ref 101–111)
CO2: 26 mmol/L (ref 22–32)
Calcium: 8.9 mg/dL (ref 8.9–10.3)
Creatinine, Ser: 0.7 mg/dL (ref 0.44–1.00)
GFR calc Af Amer: >60 mL/min (ref >60)
GFR calc non Af Amer: >60 mL/min (ref >60)
GLUCOSE: 82 mg/dL (ref 65–99)
POTASSIUM: 3.5 mmol/L (ref 3.5–5.1)
SODIUM: 139 mmol/L (ref 135–145)
Total Bilirubin: 0.5 mg/dL (ref 0.3–1.2)
Total Protein: 6.2 g/dL — ABNORMAL LOW (ref 6.5–8.1)

## 2016-11-25 LAB — LIPASE, BLOOD: Lipase: 20 U/L (ref 11–51)

## 2016-11-25 LAB — TROPONIN I: Troponin I: <0.03 ng/mL (ref <0.03)

## 2016-11-25 MED ORDER — ONDANSETRON HCL 4 MG/2ML IJ SOLN
4.0000 mg | Freq: Once | INTRAMUSCULAR | Status: AC
  Administered 2016-11-25: 4 mg via INTRAVENOUS
  Filled 2016-11-25: qty 2

## 2016-11-25 MED ORDER — SODIUM CHLORIDE 0.9 % IV SOLN
INTRAVENOUS | Status: DC
  Administered 2016-11-25: 10:00:00 via INTRAVENOUS

## 2016-11-25 MED ORDER — HYDROMORPHONE HCL 1 MG/ML IJ SOLN
1.0000 mg | Freq: Once | INTRAMUSCULAR | Status: AC
  Administered 2016-11-25: 1 mg via INTRAVENOUS
  Filled 2016-11-25: qty 1

## 2016-11-25 MED ORDER — PANTOPRAZOLE SODIUM 40 MG IV SOLR
40.0000 mg | Freq: Once | INTRAVENOUS | Status: AC
  Administered 2016-11-25: 40 mg via INTRAVENOUS
  Filled 2016-11-25: qty 40

## 2016-11-25 MED ORDER — IOPAMIDOL (ISOVUE-300) INJECTION 61%
100.0000 mL | Freq: Once | INTRAVENOUS | Status: AC | PRN
  Administered 2016-11-25: 100 mL via INTRAVENOUS

## 2016-11-25 MED ORDER — PROMETHAZINE HCL 25 MG PO TABS: 25.0000 mg | ORAL_TABLET | Freq: Four times a day (QID) | ORAL | 0 refills | Status: AC | PRN

## 2016-11-25 MED ORDER — SODIUM CHLORIDE 0.9 % IV BOLUS (SEPSIS)
500.0000 mL | Freq: Once | INTRAVENOUS | Status: AC
  Administered 2016-11-25: 500 mL via INTRAVENOUS

## 2016-11-25 MED ORDER — HYDROCODONE-ACETAMINOPHEN 5-325 MG PO TABS: 1.0000 | ORAL_TABLET | Freq: Four times a day (QID) | ORAL | 0 refills | Status: AC | PRN

## 2016-11-25 MED ORDER — FAMOTIDINE 20 MG PO TABS: 20.0000 mg | ORAL_TABLET | Freq: Two times a day (BID) | ORAL | 0 refills | Status: AC

## 2016-11-25 MED FILL — FAMOTIDINE 20 MG TABLET: 20 | 15 days supply | Qty: 30 | Fill #0

## 2016-11-25 MED FILL — HYDROCODON-APAP 5-325: 5-325 | 2 days supply | Qty: 10 | Fill #0

## 2016-11-25 MED FILL — PROMETHAZINE 25 MG TABLET: 25 | 8 days supply | Qty: 30 | Fill #0

## 2016-11-25 NOTE — Discharge Instructions (Signed)
Workup for the abdominal pain chest pain without any significant findings. May be related to a stomach ulcer. Take the Pepcid as directed. Take pain medicine as needed Phenergan as needed for nausea and vomiting. Follow-up if not improving in a week. Return for any new or worse symptoms. Work note provided.

## 2016-11-25 NOTE — ED Triage Notes (Signed)
C/o burning in stomach in upper left abd yesterday. Now has pain in left chest along with the burning sensation. No other sx today. States she had dry heaves in the am 2 days prior to start of burning in left side.

## 2016-11-25 NOTE — ED Provider Notes (Signed)
[place] [facility] Provider Note   CSN: [id number] Arrival date & time: 11/25/16  [id number]     History   Chief Complaint Chief Complaint  Patient presents with  . Chest Pain    HPI Sandra Reilly is a 41 y.o. female.  Patient with onset of left upper quadrant abdominal pain at 7:30 yesterday evening. It's been constant. From from the onset radiated into the left chest. Not associated with nausea or vomiting. No shortness of breath. Pain is currently 7 out of 10. Not made worse or better by anything. Patient does have an appetite food does not seem to make it better or worse. No history of similar pain.      Past Medical History:  Diagnosis Date  . Precancerous lesion     There are no active problems to display for this patient.   Past Surgical History:  Procedure Laterality Date  . ABDOMINAL HYSTERECTOMY    . CHOLECYSTECTOMY      OB History    No data available       Home Medications    Prior to Admission medications   Medication Sig Start Date End Date Taking? Authorizing Provider  famotidine (PEPCID) 20 MG tablet Take 1 tablet (20 mg total) by mouth 2 (two) times daily. 11/25/16   Fredia Sorrow, MD  HYDROcodone-acetaminophen (NORCO/VICODIN) 5-325 MG tablet Take 1-2 tablets by mouth every 6 (six) hours as needed for moderate pain. 11/25/16   Fredia Sorrow, MD  promethazine (PHENERGAN) 25 MG tablet Take 1 tablet (25 mg total) by mouth every 6 (six) hours as needed for nausea or vomiting. 11/25/16   Fredia Sorrow, MD    Family History No family history on file.  Social History Social History  Substance Use Topics  . Smoking status: Current Every Day Smoker  . Smokeless tobacco: Not on file  . Alcohol use No     Allergies   Patient has no known allergies.   Review of Systems Review of Systems  Constitutional: Negative for fever.  HENT: Negative for congestion.   Eyes: Negative for redness.  Respiratory: Negative for shortness of breath.     Cardiovascular: Positive for chest pain.  Gastrointestinal: Positive for abdominal pain. Negative for nausea and vomiting.  Genitourinary: Negative for dysuria.  Musculoskeletal: Negative for back pain.  Skin: Negative for rash.  Neurological: Negative for headaches.  Hematological: Does not bruise/bleed easily.  Psychiatric/Behavioral: Negative for confusion.     Physical Exam Updated Vital Signs BP 123/80   Pulse (!) 59   Resp 23   Ht 5\' 5"  (1.651 m)   Wt 86.2 kg   SpO2 100%   BMI 31.62 kg/m   Physical Exam  Constitutional: She is oriented to person, place, and time. She appears well-developed and well-nourished. No distress.  HENT:  Head: Normocephalic and atraumatic.  Mouth/Throat: Oropharynx is clear and moist.  Eyes: Conjunctivae and EOM are normal. Pupils are equal, round, and reactive to light.  Neck: Normal range of motion. Neck supple.  Cardiovascular: Normal rate, regular rhythm and normal heart sounds.   Pulmonary/Chest: Effort normal and breath sounds normal. No respiratory distress.  Abdominal: Soft. Bowel sounds are normal. There is no tenderness.  Musculoskeletal: Normal range of motion.  Neurological: She is alert and oriented to person, place, and time. No cranial nerve deficit or sensory deficit. She exhibits normal muscle tone. Coordination normal.  Skin: Skin is warm. No rash noted.  Nursing note and vitals reviewed.    ED Treatments /  Results  Labs (all labs ordered are listed, but only abnormal results are displayed) Labs Reviewed  COMPREHENSIVE METABOLIC PANEL - Abnormal; Notable for the following:       Result Value   Total Protein 6.2 (*)    All other components within normal limits  CBC  TROPONIN I  LIPASE, BLOOD    EKG  EKG Interpretation  Date/Time:  Thursday November 25 2016 09:37:29 EST Ventricular Rate:  67 PR Interval:    QRS Duration: 107 QT Interval:  389 QTC Calculation: 411 R Axis:   71 Text Interpretation:  Sinus  rhythm Baseline wander in lead(s) V1 No previous ECGs available Interpretation limited secondary to artifact Confirmed by Shantanique Hodo  MD, Arrie Zuercher [id number]) on 11/25/2016 9:43:29 AM       Radiology Dg Chest 2 View  Result Date: 11/25/2016 CLINICAL DATA:  Chest pain for several hours EXAM: CHEST  2 VIEW COMPARISON:  02/10/2009 FINDINGS: The heart size and mediastinal contours are within normal limits. Both lungs are clear. The visualized skeletal structures are unremarkable. IMPRESSION: No active cardiopulmonary disease. Electronically Signed   By: Inez Catalina M.D.   On: 11/25/2016 10:15   Ct Abdomen Pelvis W Contrast  Result Date: 11/25/2016 CLINICAL DATA:  LUQ pain. C/o burning in stomach in upper left abd yesterday. Now has pain in left chest along with the burning sensation. States she had dry heaves in the am 2 days prior to start of burning in left side. EXAM: CT ABDOMEN AND PELVIS WITH CONTRAST TECHNIQUE: Multidetector CT imaging of the abdomen and pelvis was performed using the standard protocol following bolus administration of intravenous contrast. CONTRAST:  159mL ISOVUE-300 IOPAMIDOL (ISOVUE-300) INJECTION 61% COMPARISON:  08/28/2008 FINDINGS: Lower chest: Lung bases are clear. Hepatobiliary: Postcholecystectomy. There is mild extrahepatic biliary duct dilatation with common bile duct measuring 10 mm. This is similar to 10 mm on CT of 08/28/2008. No significant intrahepatic duct dilatation. No focal hepatic lesion. Pancreas: No pancreatic duct dilatation. 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 No evidence of chronic or acute pancreatitis. Spleen: Normal spleen Adrenals/urinary tract: Adrenal glands and kidneys are normal. The ureters and bladder normal. Stomach/Bowel: Stomach, small bowel, appendix, and cecum are normal. The colon and rectosigmoid colon are normal. Vascular/Lymphatic: Abdominal aorta is normal caliber. There is no retroperitoneal or periportal lymphadenopathy. No pelvic  lymphadenopathy. Reproductive: Post hysterectomy.  Ovaries are normal. Other: No inguinal hernia.  No umbilical hernia. Musculoskeletal: No aggressive osseous lesion. The fatty cutaneous lesions anterior to the LEFT hip measuring 4.3 cm increased from 2.2 cm. IMPRESSION: 1. No acute findings in the abdomen pelvis. 2. 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 3. Enlarged benign-appearing fatty lesion along the LEFT flank. Electronically Signed   By: Suzy Bouchard M.D.   On: 11/25/2016 12:27    Procedures Procedures (including critical care time)  Medications Ordered in ED Medications  0.9 %  sodium chloride infusion ( Intravenous New Bag/Given 11/25/16 1013)  sodium chloride 0.9 % bolus 500 mL (500 mLs Intravenous New Bag/Given 11/25/16 1013)  ondansetron (ZOFRAN) injection 4 mg (4 mg Intravenous Given 11/25/16 1013)  HYDROmorphone (DILAUDID) injection 1 mg (1 mg Intravenous Given 11/25/16 1013)  pantoprazole (PROTONIX) injection 40 mg (40 mg Intravenous Given 11/25/16 1013)  iopamidol (ISOVUE-300) 61 % injection 100 mL (100 mLs Intravenous Contrast Given 11/25/16 1145)     Initial Impression / Assessment and Plan / ED Course  I have reviewed the triage vital signs and the nursing notes.  Pertinent labs &  imaging results that were available during my care of the patient were reviewed by me and considered in my medical decision making (see chart for details).    Patient with onset of left upper quadrant abdominal pain last evening at 7:30. Radiated into the left side of the chest. Not associated with any nausea or vomiting. Pain currently 7 out of to most pain is in the left upper quadrant.  Workup for the chest pain troponin negative chest x-ray negative room air sats are 100% concerned for pulmonary embolus. No real shortness of breath. Would expect troponin to be abnormal after constant pain since 7:30 last night. EKG without any acute changes.  Workup for the  abdominal pain included labs without any significant abnormality including normal lipase and liver function test. No significant leukocytosis. And CT scan abdomen and pelvis was negative.  Patient was treated with Protonix and hydromorphone with improvement. Possible could represent a stomach ulcer but not made worse or better by food. Also not made worse or better by any movement. Will give a trial of Pepcid over the next 2 weeks.   Final Clinical Impressions(s) / ED Diagnoses   Final diagnoses:  Chest pain, unspecified type  Left upper quadrant pain    New Prescriptions New Prescriptions   FAMOTIDINE (PEPCID) 20 MG TABLET    Take 1 tablet (20 mg total) by mouth 2 (two) times daily.   HYDROCODONE-ACETAMINOPHEN (NORCO/VICODIN) 5-325 MG TABLET    Take 1-2 tablets by mouth every 6 (six) hours as needed for moderate pain.   PROMETHAZINE (PHENERGAN) 25 MG TABLET    Take 1 tablet (25 mg total) by mouth every 6 (six) hours as needed for nausea or vomiting.     Fredia Sorrow, MD 11/25/16 1248

## 2016-11-25 NOTE — ED Notes (Signed)
amb to BR w/o assist and w/o difficulty

## 2018-06-30 IMAGING — CR DG CHEST 2V
2 series · 2 of 2 positions shown · non-contrast
Comparison: 02/10/2009

CLINICAL DATA: Chest pain for several hours

EXAM:
CHEST  2 VIEW

[w chest pa]
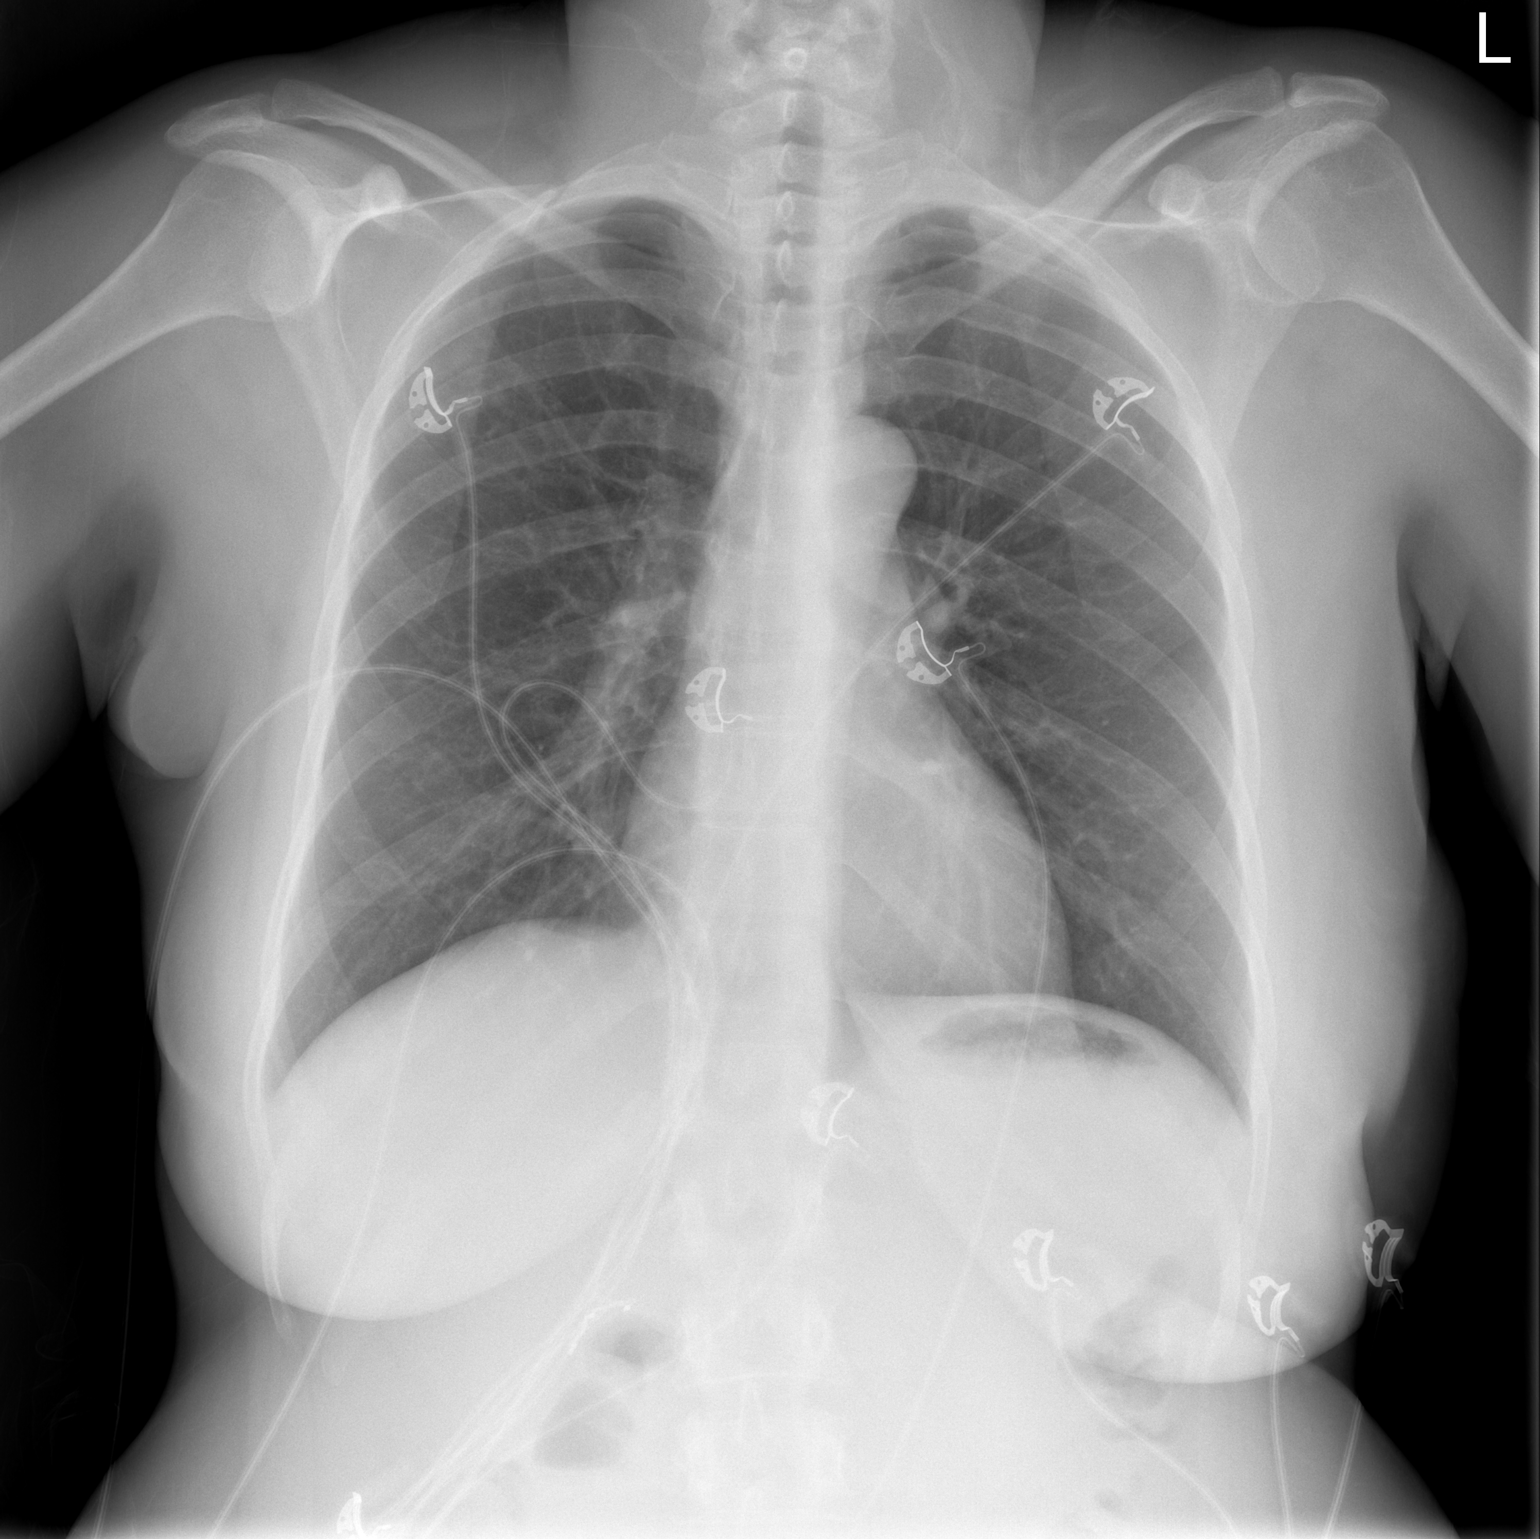

[w chest lat]
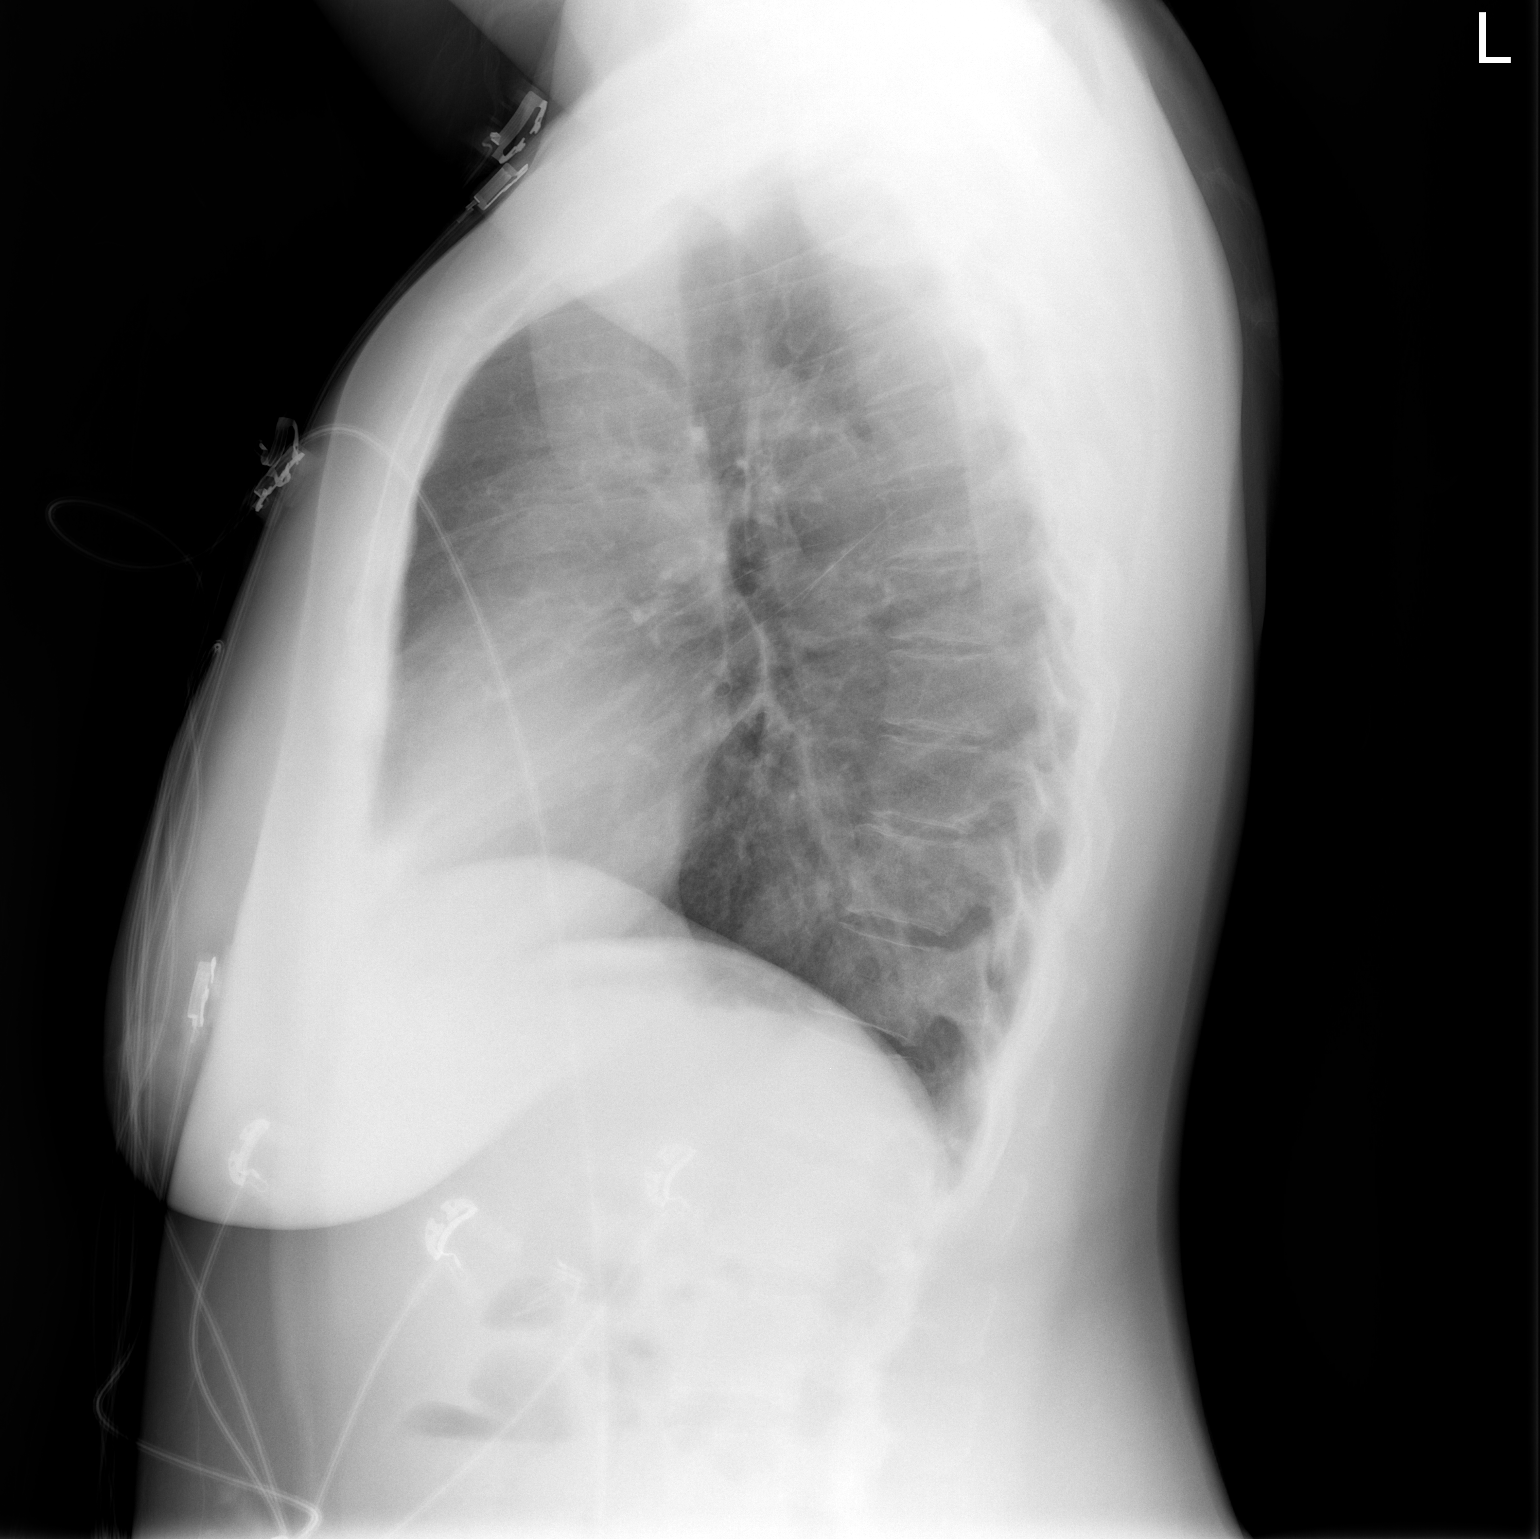

[2 of 2 positions shown; findings below may reference images not displayed]

FINDINGS: The heart size and mediastinal contours are within normal limits.
Both lungs are clear. The visualized skeletal structures are
unremarkable.
IMPRESSION: No active cardiopulmonary disease.

## 2018-06-30 IMAGING — CT CT ABD-PELV W/ CM
2 of 5 series · 17 of 46 positions shown, 19 images · IV contrast (APPLIED)
Comparison: 08/28/2008

CLINICAL DATA: LUQ pain. C/o burning in stomach in upper left abd
yesterday. Now has pain in left chest along with the burning
sensation. States she had dry heaves in the am 2 days prior to start
of burning in left side.

EXAM:
CT ABDOMEN AND PELVIS WITH CONTRAST
TECHNIQUE: Multidetector CT imaging of the abdomen and pelvis was performed
using the standard protocol following bolus administration of
intravenous contrast.
CONTRAST:  100mL [id number] IOPAMIDOL ([id number]) INJECTION 61%

[Series 2: axial st · axial · 0.98mm/px · z∈[-490,-75]mm · 14 of 93 slices shown, 16 images]
[im 5/93  soft-tissue]
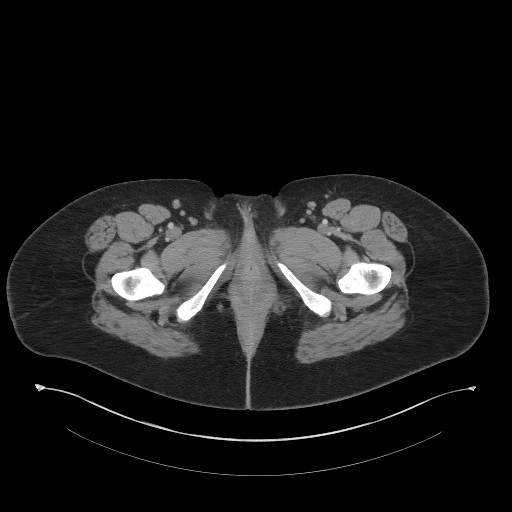
[im 5/93  bone]
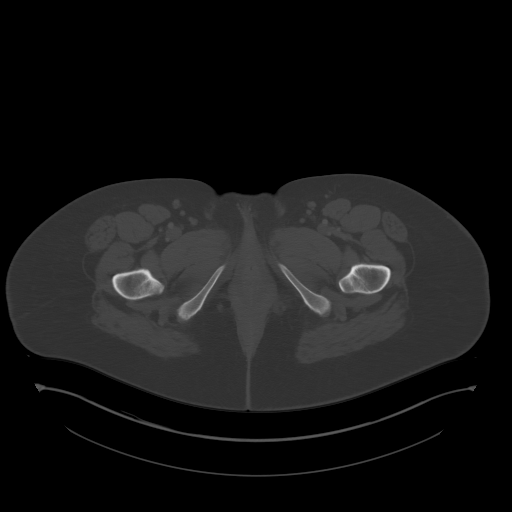
[im 14/93  soft-tissue]
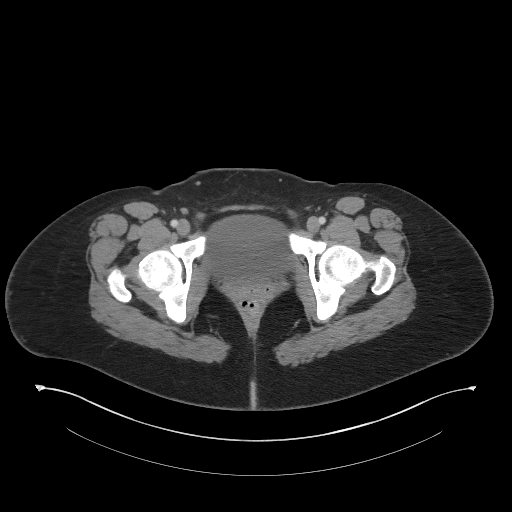
[im 19/93  soft-tissue]
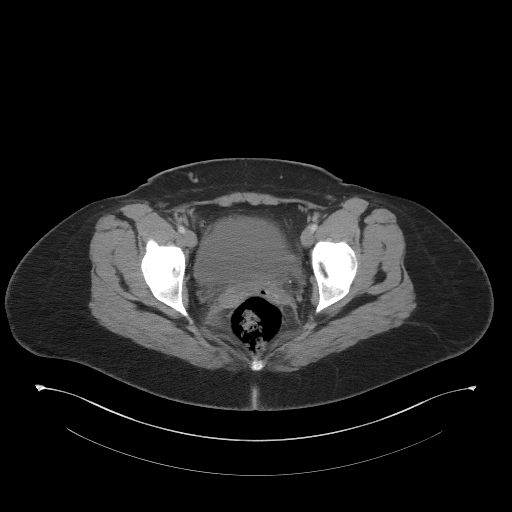
[im 24/93  soft-tissue]
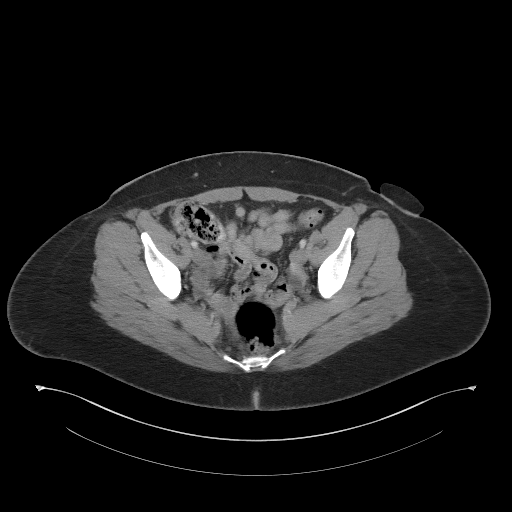
[im 33/93  soft-tissue]
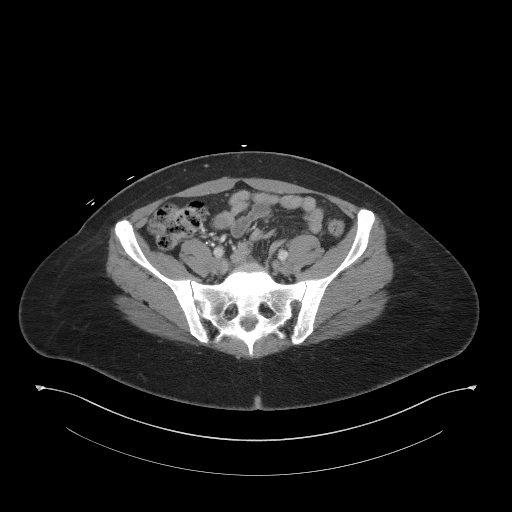
[im 37/93  soft-tissue]
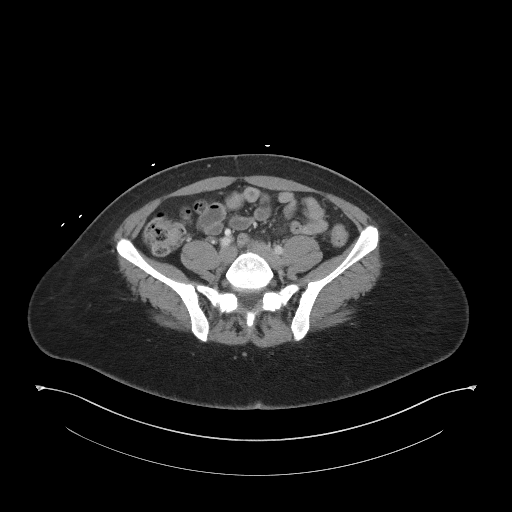
[im 42/93  soft-tissue]
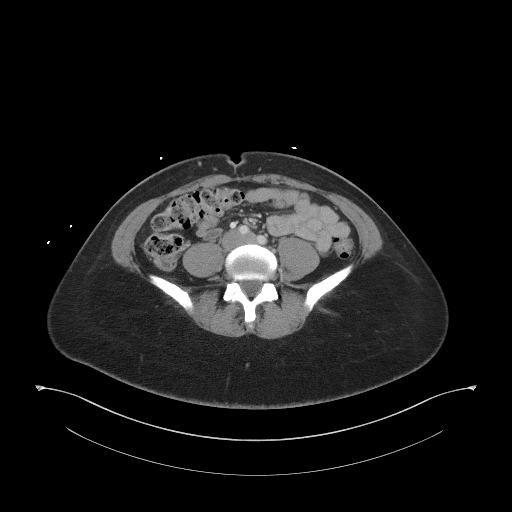
[im 51/93  soft-tissue]
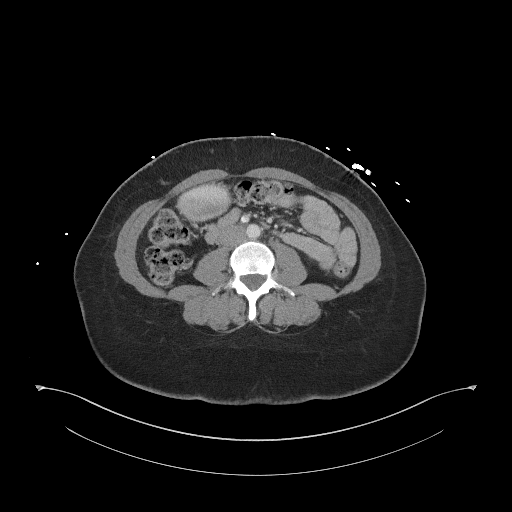
[im 56/93  soft-tissue]
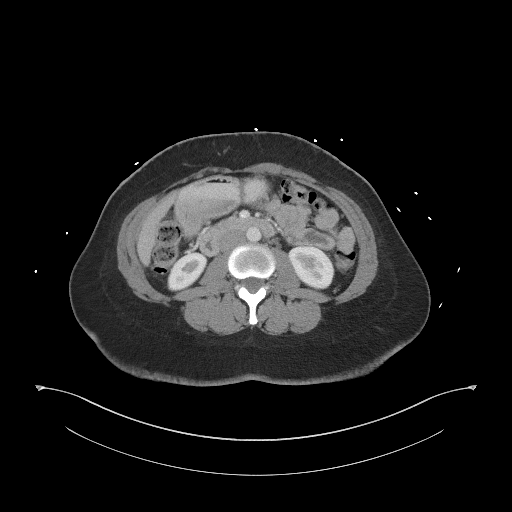
[im 56/93  bone]
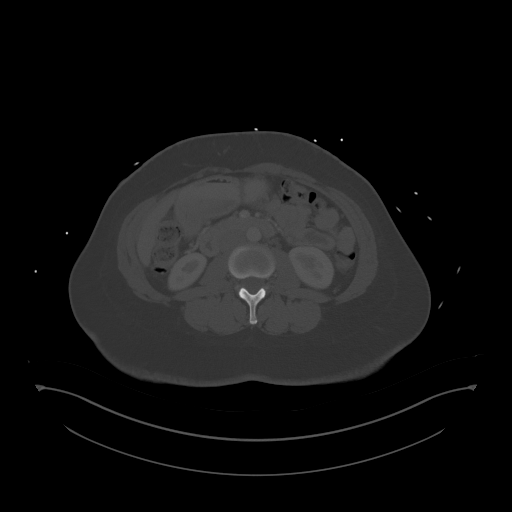
[im 60/93  soft-tissue]
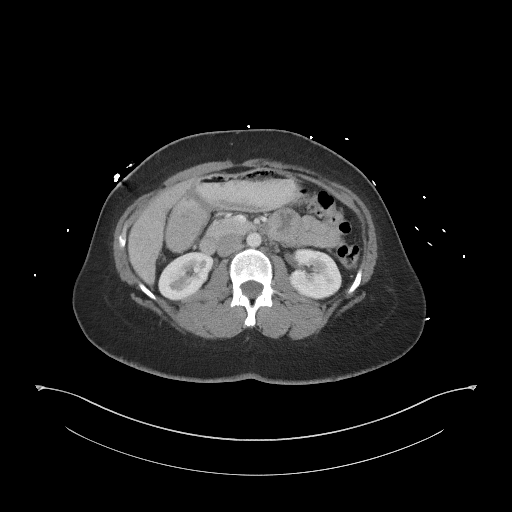
[im 70/93  soft-tissue]
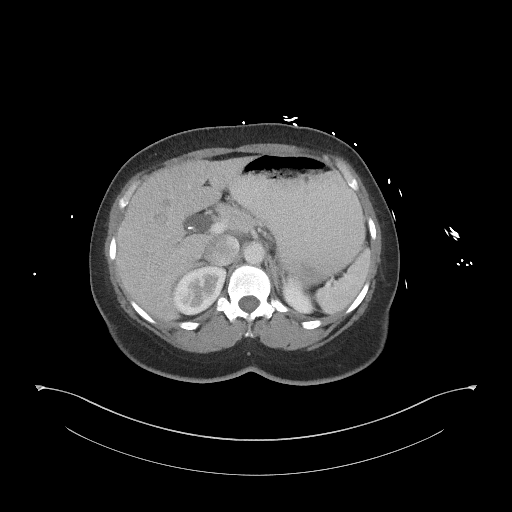
[im 74/93  soft-tissue]
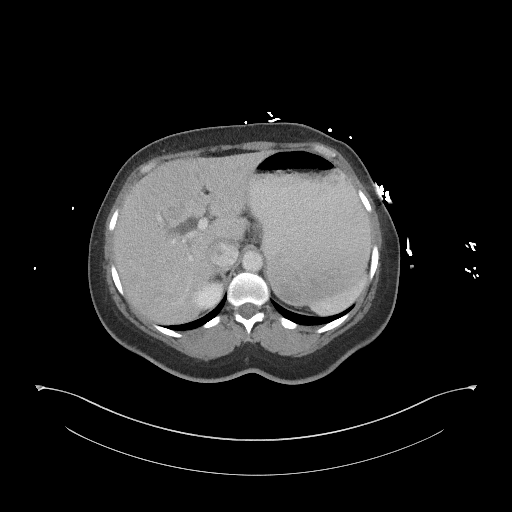
[im 79/93  soft-tissue]
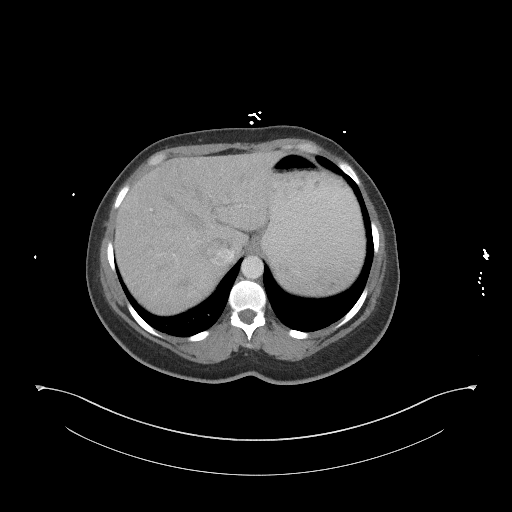
[im 88/93  soft-tissue]
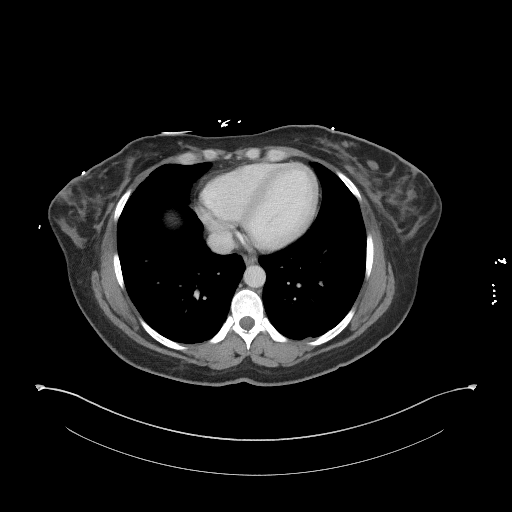

[Series 5: coronal st · coronal · 0.99mm/px · 3 of 85 slices shown]
[im 29/85  soft-tissue]
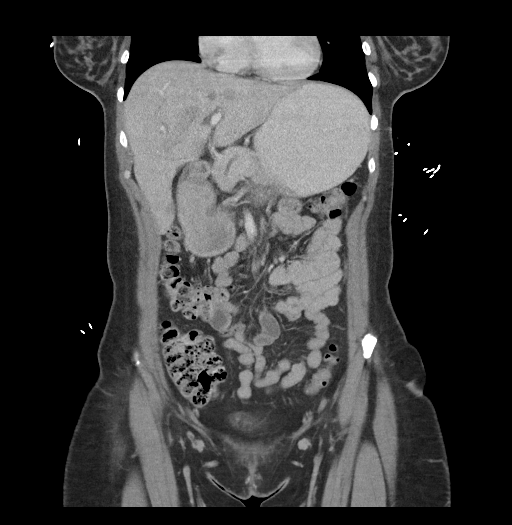
[im 38/85  soft-tissue]
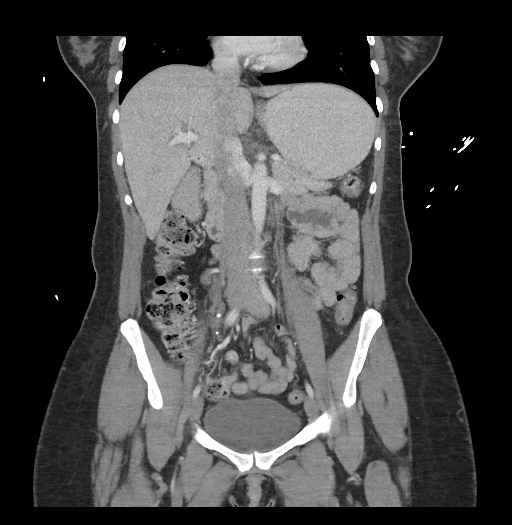
[im 47/85  soft-tissue]
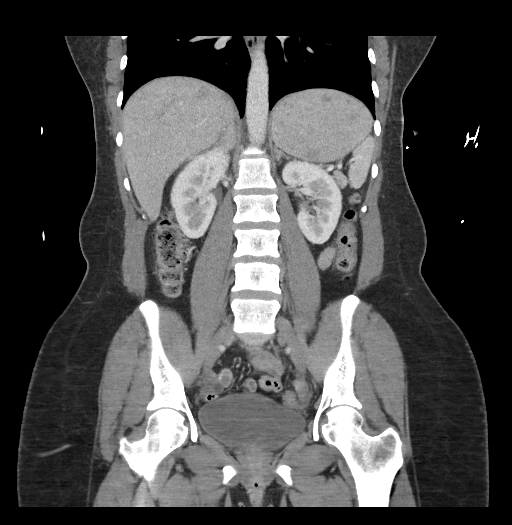

[17 of 46 positions shown; findings below may reference images not displayed]

FINDINGS: Lower chest: Lung bases are clear.

Hepatobiliary: Postcholecystectomy. There is mild extrahepatic
biliary duct dilatation with common bile duct measuring 10 mm. This
is similar to 10 mm on CT of 08/28/2008. No significant intrahepatic
duct dilatation. No focal hepatic lesion.

Pancreas: No pancreatic duct dilatation. There is pancreas divisum
variant ductal anatomy. No evidence of chronic or acute
pancreatitis.

Spleen: Normal spleen

Adrenals/urinary tract: Adrenal glands and kidneys are normal. The
ureters and bladder normal.

Stomach/Bowel: Stomach, small bowel, appendix, and cecum are normal.
The colon and rectosigmoid colon are normal.

Vascular/Lymphatic: Abdominal aorta is normal caliber. There is no
retroperitoneal or periportal lymphadenopathy. No pelvic
lymphadenopathy.

Reproductive: Post hysterectomy.  Ovaries are normal.

Other: No inguinal hernia.  No umbilical hernia.

Musculoskeletal: No aggressive osseous lesion. The fatty cutaneous
lesions anterior to the LEFT hip measuring 4.3 cm increased from
cm.
IMPRESSION: 1. No acute findings in the abdomen pelvis.
2. Chronic extrahepatic biliary duct dilatation following
cholecystectomy.
3. Enlarged benign-appearing fatty lesion along the LEFT flank.
